# Patient Record
Sex: Female | Born: 1965 | Race: White | Hispanic: No | Marital: Married | Smoking: Former smoker
Health system: Southern US, Community
[De-identification: ages and names within clinical notes are randomized; demographics above are authoritative.]

## PROBLEM LIST (undated history)

## (undated) DIAGNOSIS — I7100 Dissection of unspecified site of aorta: Secondary | ICD-10-CM

## (undated) DIAGNOSIS — I2542 Coronary artery dissection: Secondary | ICD-10-CM

## (undated) HISTORY — PX: CARDIAC SURGERY: SHX584

---

## 2017-10-11 ENCOUNTER — Emergency Department (HOSPITAL_COMMUNITY)
Admission: EM | Admit: 2017-10-11 | Discharge: 2017-10-11 | Disposition: A | Discharge status: Home / Self Care | Payer: Medicare Other | Attending: Emergency Medicine | Admitting: Emergency Medicine

## 2017-10-11 ENCOUNTER — Encounter (HOSPITAL_COMMUNITY): Payer: Self-pay | Admitting: *Deleted

## 2017-10-11 ENCOUNTER — Emergency Department (HOSPITAL_COMMUNITY): Payer: Medicare Other

## 2017-10-11 DIAGNOSIS — K5792 Diverticulitis of intestine, part unspecified, without perforation or abscess without bleeding: Secondary | ICD-10-CM

## 2017-10-11 DIAGNOSIS — I2510 Atherosclerotic heart disease of native coronary artery without angina pectoris: Secondary | ICD-10-CM | POA: Insufficient documentation

## 2017-10-11 DIAGNOSIS — Z951 Presence of aortocoronary bypass graft: Secondary | ICD-10-CM | POA: Diagnosis not present

## 2017-10-11 DIAGNOSIS — R109 Unspecified abdominal pain: Secondary | ICD-10-CM

## 2017-10-11 DIAGNOSIS — R1032 Left lower quadrant pain: Secondary | ICD-10-CM | POA: Diagnosis present

## 2017-10-11 HISTORY — DX: Dissection of unspecified site of aorta: I71.00

## 2017-10-11 LAB — COMPREHENSIVE METABOLIC PANEL
ALBUMIN: 3.9 g/dL (ref 3.5–5.0)
ALT: 33 U/L (ref 0–44)
ANION GAP: 11 (ref 5–15)
AST: 28 U/L (ref 15–41)
Alkaline Phosphatase: 86 U/L (ref 38–126)
BILIRUBIN TOTAL: 0.5 mg/dL (ref 0.3–1.2)
BUN: 13 mg/dL (ref 6–20)
CO2: 27 mmol/L (ref 22–32)
Calcium: 9.6 mg/dL (ref 8.9–10.3)
Chloride: 105 mmol/L (ref 98–111)
Creatinine, Ser: 0.64 mg/dL (ref 0.44–1.00)
GFR calc Af Amer: >60 mL/min (ref >60)
GFR calc non Af Amer: >60 mL/min (ref >60)
Glucose, Bld: 121 mg/dL — ABNORMAL HIGH (ref 70–99)
POTASSIUM: 3.5 mmol/L (ref 3.5–5.1)
Sodium: 143 mmol/L (ref 135–145)
TOTAL PROTEIN: 7.5 g/dL (ref 6.5–8.1)

## 2017-10-11 LAB — URINALYSIS, ROUTINE W REFLEX MICROSCOPIC
BILIRUBIN URINE: NEGATIVE
Glucose, UA: NEGATIVE mg/dL
Ketones, ur: NEGATIVE mg/dL
Nitrite: NEGATIVE
PROTEIN: NEGATIVE mg/dL
Specific Gravity, Urine: 1.024 (ref 1.005–1.030)
pH: 5 (ref 5.0–8.0)

## 2017-10-11 LAB — CBC
HCT: 40.5 % (ref 36.0–46.0)
Hemoglobin: 13.2 g/dL (ref 12.0–15.0)
MCH: 30.3 pg (ref 26.0–34.0)
MCHC: 32.6 g/dL (ref 30.0–36.0)
MCV: 92.9 fL (ref 78.0–100.0)
PLATELETS: 395 10*3/uL (ref 150–400)
RBC: 4.36 MIL/uL (ref 3.87–5.11)
RDW: 12.5 % (ref 11.5–15.5)
WBC: 8.1 10*3/uL (ref 4.0–10.5)

## 2017-10-11 LAB — URINALYSIS, MICROSCOPIC (REFLEX)

## 2017-10-11 LAB — LIPASE, BLOOD: LIPASE: 28 U/L (ref 11–51)

## 2017-10-11 MED ORDER — HYDROMORPHONE HCL 1 MG/ML IJ SOLN
1.0000 mg | Freq: Once | INTRAMUSCULAR | Status: AC
Start: 2017-10-11 — End: 2017-10-11
  Administered 2017-10-11: 1 mg via INTRAVENOUS
  Filled 2017-10-11: qty 1

## 2017-10-11 MED ORDER — OXYCODONE-ACETAMINOPHEN 5-325 MG PO TABS: 1.0000 | ORAL_TABLET | ORAL | 0 refills | Status: DC | PRN

## 2017-10-11 MED ORDER — SODIUM CHLORIDE 0.9 % IV BOLUS
500.0000 mL | Freq: Once | INTRAVENOUS | Status: AC
Start: 2017-10-11 — End: 2017-10-11
  Administered 2017-10-11: 500 mL via INTRAVENOUS

## 2017-10-11 MED ORDER — AMOXICILLIN-POT CLAVULANATE 875-125 MG PO TABS: 1.0000 | ORAL_TABLET | Freq: Two times a day (BID) | ORAL | 0 refills | Status: DC

## 2017-10-11 NOTE — ED Notes (Signed)
Pt using restroom providing Urine sample.

## 2017-10-11 NOTE — ED Triage Notes (Signed)
Pt complains of left sided flank pain since yesterday. Pt states she has increased urinary frequency at night. Pt also states [place] from [place] yesterday. Pt denies hematuria.

## 2017-10-11 NOTE — ED Notes (Signed)
Urine Culture sent to lab just in case Culture is ordered after urine collection.

## 2017-10-11 NOTE — ED Provider Notes (Addendum)
[facility] Emergency Department Provider Note MRN:  [id number]  Arrival date & time: 10/11/17     Chief Complaint   Flank Pain   History of Present Illness   Bethany ShipperKaren Newman is a 52 y.o. year-old female with a history of CAD and multiple stents presenting to the ED with chief complaint of left flank pain.  The pain is located in the left flank and radiates to the left lower quadrant.  The pain is been present for 3 days, began gradually, and has been constant since that time.  Pain is described as moderate in severity, achy pain.  Pain not relieved by home ibuprofen.  Patient denies fevers or chills, no chest pain or shortness of breath, no dysuria.  Review of Systems  A complete 10 system review of systems was obtained and all systems are negative except as noted in the HPI and PMH.  Patient's Health History    PMH:  CAD, CABG  Past Surgical History:  Procedure Laterality Date  . CARDIAC SURGERY      No family history on file.  Social History   Socioeconomic History  . Marital status: Married    Spouse name: Not on file  . Number of children: Not on file  . Years of education: Not on file  . Highest education level: Not on file  Occupational History  . Not on file  Social Needs  . Financial resource strain: Not on file  . Food insecurity:    Worry: Not on file    Inability: Not on file  . Transportation needs:    Medical: Not on file    Non-medical: Not on file  Tobacco Use  . Smoking status: Former Games developermoker  . Smokeless tobacco: Never Used  Substance and Sexual Activity  . Alcohol use: Yes  . Drug use: Not on file  . Sexual activity: Not on file  Lifestyle  . Physical activity:    Days per week: Not on file    Minutes per session: Not on file  . Stress: Not on file  Relationships  . Social connections:    Talks on phone: Not on file    Gets together: Not on file    Attends religious service: Not on file    Active member of club or  organization: Not on file    Attends meetings of clubs or organizations: Not on file    Relationship status: Not on file  . Intimate partner violence:    Fear of current or ex partner: Not on file    Emotionally abused: Not on file    Physically abused: Not on file    Forced sexual activity: Not on file  Other Topics Concern  . Not on file  Social History Narrative  . Not on file     Physical Exam  Vital Signs and Nursing Notes reviewed Vitals:   10/11/17 1200 10/11/17 1230  BP: (!) 163/98 (!) 146/82  Pulse: 80 78  Resp: 18 18  Temp:    SpO2: 98% 97%    CONSTITUTIONAL:  well-appearing, NAD NEURO:  Alert and oriented x 3, no focal deficits EYES:  eyes equal and reactive ENT/NECK:  no LAD, no JVD CARDIO:  regular rate, well-perfused, normal S1 and S2 PULM:  CTAB no wheezing or rhonchi GI/GU:  normal bowel sounds, non-distended, non-tender MSK/SPINE:  No gross deformities, no edema SKIN:  no rash, atraumatic PSYCH:  Appropriate speech and behavior  Diagnostic and Interventional Summary  EKG Interpretation  Date/Time:    Ventricular Rate:    PR Interval:    QRS Duration:   QT Interval:    QTC Calculation:   R Axis:     Text Interpretation:        Labs Reviewed  URINALYSIS, ROUTINE W REFLEX MICROSCOPIC - Abnormal; Notable for the following components:      Result Value   Hgb urine dipstick SMALL (*)    Leukocytes, UA SMALL (*)    All other components within normal limits  COMPREHENSIVE METABOLIC PANEL - Abnormal; Notable for the following components:   Glucose, Bld 121 (*)    All other components within normal limits  URINALYSIS, MICROSCOPIC (REFLEX) - Abnormal; Notable for the following components:   Bacteria, UA RARE (*)    All other components within normal limits  CBC  LIPASE, BLOOD    CT RENAL STONE STUDY  Final Result      Medications  HYDROmorphone (DILAUDID) injection 1 mg (1 mg Intravenous Given 10/11/17 1233)  sodium chloride 0.9 % bolus  500 mL (500 mLs Intravenous New Bag/Given 10/11/17 1232)     Procedures Critical Care  ED Course and Medical Decision Making  I have reviewed the triage vital signs and the nursing notes.  Pertinent labs & imaging results that were available during my care of the patient were reviewed by me and considered in my medical decision making (see below for details).  53 year old female with significant CAD multiple stents in the past as well as 3 episodes of cardiac arrest years ago presenting with chief complaint of left flank pain that has been progressively worse and constant for the past 3 days.  History of kidney stones on the right years ago.  Vital signs stable, afebrile well-appearing, left CVA tenderness as well as left lower quadrant tenderness to palpation.  Favoring kidney stone versus diverticulitis.  Patient also has history of fibromuscular dysplasia and aneurysms.  Exam and symptomatology not concerning for abdominal ischemia.  Will obtain labs and CT.  [MB]  CT of the abdomen reveals diverticulitis.  No evidence of vascular abnormality.  Patient continues to be well-appearing with stable vitals.  Will give prescription for Augmentin.  We will follow-up closely with her regular doctors.  After the discussed management above, the patient was determined to be safe for discharge.  The patient was in agreement with this plan and all questions regarding their care were answered.  ED return precautions were discussed and the patient will return to the ED with any significant worsening of condition.  Addendum: Upon review of the chart it was observed that a past medical history of aortic dissection was noted by nursing staff during the patient's ED encounter.  This was surprising to me, as we discussed the patient's past medical history in depth and she denied such significant issues.  I was able to call the patient after discharge, she had already filled her prescription and was home generally  feeling well.  She denied ever having a history of aortic dissection, explaining that it was a dissection of her LAD.  This issue has been settled and nonactive for some time.  The CT performed today was noncontrast given the initial concern for kidney stone or diverticulitis.  Explained to patient that without IV contrast, aortic pathology cannot be completely excluded.  Patient agrees to return to the nearest emergency department with any change or worsening of her condition.  Elmer Sow. Pilar Plate, MD [facility] Emergency Medicine [facility]  [facility] [email or web address]  Final Clinical Impressions(s) / ED Diagnoses  No diagnosis found.  ED Discharge Orders    None         Sabas Sous, MD 10/11/17 1715    Sabas Sous, MD 10/11/17 [phone]

## 2017-10-11 NOTE — Discharge Instructions (Addendum)
[facility].  After careful evaluation, we did not find any emergent condition requiring admission or further testing in the hospital.  Your symptoms today seem to be due to diverticulitis.  Please take the antibiotics as directed and follow-up with your regular care provider.  Use the pain medication as needed.  Please return to the Emergency Department if you experience any worsening of your condition.  We encourage you to follow up with a primary care provider.  Thank you for allowing us to be a part of your care.

## 2017-10-12 ENCOUNTER — Emergency Department (HOSPITAL_COMMUNITY)
Admission: EM | Admit: 2017-10-12 | Discharge: 2017-10-12 | Disposition: A | Discharge status: Home / Self Care | Payer: Medicare Other | Attending: Emergency Medicine | Admitting: Emergency Medicine

## 2017-10-12 ENCOUNTER — Other Ambulatory Visit: Payer: Self-pay

## 2017-10-12 ENCOUNTER — Encounter (HOSPITAL_COMMUNITY): Payer: Self-pay

## 2017-10-12 DIAGNOSIS — Z79899 Other long term (current) drug therapy: Secondary | ICD-10-CM | POA: Diagnosis not present

## 2017-10-12 DIAGNOSIS — R109 Unspecified abdominal pain: Secondary | ICD-10-CM | POA: Diagnosis not present

## 2017-10-12 DIAGNOSIS — Z87891 Personal history of nicotine dependence: Secondary | ICD-10-CM | POA: Insufficient documentation

## 2017-10-12 DIAGNOSIS — Z7982 Long term (current) use of aspirin: Secondary | ICD-10-CM | POA: Insufficient documentation

## 2017-10-12 DIAGNOSIS — B029 Zoster without complications: Secondary | ICD-10-CM | POA: Insufficient documentation

## 2017-10-12 DIAGNOSIS — I2542 Coronary artery dissection: Secondary | ICD-10-CM

## 2017-10-12 HISTORY — DX: Coronary artery dissection: I25.42

## 2017-10-12 MED ORDER — VALACYCLOVIR HCL 1 G PO TABS: 1000.0000 mg | ORAL_TABLET | Freq: Three times a day (TID) | ORAL | 0 refills | Status: DC

## 2017-10-12 MED ORDER — PREDNISONE 20 MG PO TABS: ORAL_TABLET | ORAL | 0 refills | Status: DC

## 2017-10-12 NOTE — Discharge Instructions (Addendum)
It was our pleasure to provide your ER care today - we hope that you feel better.  Take valtrex and prednisone as prescribed.  Your blood pressure is high today - follow up with primary care doctor in the next 1-2 weeks.  Return to ER if worse, new symptoms, high fevers, persistent vomiting, other concern.

## 2017-10-12 NOTE — ED Provider Notes (Signed)
[facility]-EMERGENCY DEPT Provider Note   CSN: [id number] Arrival date & time: 10/12/17  1357     History   Chief Complaint Chief Complaint  Patient presents with  . Flank Pain    HPI Bethany Newman is a 52 y.o. female.  Patient c/o pain to left flank for past 2 days. Pain constant, dull, wraps around left flank, mod-severe, and today developed a sparse rash in same area as distribution of pain. No specific exacerbating or alleviating factors. No hx same rash. Denies fever or chills. No vomiting. Is having normal bms. No dysuria or hematuria. Recently had ct eval for same symptoms, but did not have rash then.   The history is provided by the patient.  Flank Pain  Pertinent negatives include no chest pain, no headaches and no shortness of breath.    Past Medical History:  Diagnosis Date  . Aortic dissection (HCC)   . Spontaneous dissection of coronary artery     Patient Active Problem List   Diagnosis Date Noted  . Spontaneous dissection of coronary artery     Past Surgical History:  Procedure Laterality Date  . CARDIAC SURGERY       OB History   None      Home Medications    Prior to Admission medications   Medication Sig Start Date End Date Taking? Authorizing Provider  amoxicillin-clavulanate (AUGMENTIN) 875-125 MG tablet Take 1 tablet by mouth every 12 (twelve) hours. 10/11/17  Yes Sabas Sous, MD  aspirin EC 81 MG tablet Take 81 mg by mouth daily.   Yes [provider]  busPIRone (BUSPAR) 10 MG tablet Take 10 mg by mouth daily.   Yes [provider]  diltiazem (CARDIZEM CD) 240 MG 24 hr capsule Take 240 mg by mouth daily.   Yes [provider]  FLUoxetine (PROZAC) 40 MG capsule Take 40 mg by mouth daily.   Yes [provider]  metoprolol succinate (TOPROL-XL) 50 MG 24 hr tablet Take 50 mg by mouth daily. 09/10/17  Yes [provider]  nitroGLYCERIN (NITROLINGUAL) 0.4 MG/SPRAY spray Place 1  spray under the tongue every 5 (five) minutes x 3 doses as needed for chest pain.   Yes [provider]  ranolazine (RANEXA) 500 MG 12 hr tablet Take 500 mg by mouth 2 (two) times daily.   Yes [provider]  oxyCODONE-acetaminophen (PERCOCET/ROXICET) 5-325 MG tablet Take 1 tablet by mouth every 4 (four) hours as needed for severe pain. 10/11/17   Sabas Sous, MD    Family History No family history on file.  Social History Social History   Tobacco Use  . Smoking status: Former Games developer  . Smokeless tobacco: Never Used  Substance Use Topics  . Alcohol use: Yes  . Drug use: Not on file     Allergies   Patient has no known allergies.   Review of Systems Review of Systems  Constitutional: Negative for fever.  HENT: Negative for sore throat.   Eyes: Negative for redness.  Respiratory: Negative for shortness of breath.   Cardiovascular: Negative for chest pain.  Gastrointestinal: Negative for constipation and diarrhea.  Genitourinary: Positive for flank pain. Negative for dysuria.  Musculoskeletal: Negative for neck pain.  Skin: Positive for rash.  Neurological: Negative for headaches.  Hematological: Does not bruise/bleed easily.  Psychiatric/Behavioral: Negative for confusion.     Physical Exam Updated Vital Signs BP (!) 162/90   Pulse 83   Temp 98.2 F (36.8 C) (Oral)  Resp 18   SpO2 100%   Physical Exam  Constitutional: She appears well-developed and well-nourished.  HENT:  Head: Atraumatic.  Eyes: Conjunctivae are normal. No scleral icterus.  Neck: Neck supple. No tracheal deviation present.  Cardiovascular: Normal rate.  Pulmonary/Chest: Effort normal. No respiratory distress.  Abdominal: Soft. Normal appearance and bowel sounds are normal. She exhibits no distension. There is no tenderness.  Genitourinary:  Genitourinary Comments: No cva tenderness  Musculoskeletal: She exhibits no edema.  Neurological: She is alert.  Skin: Skin is  warm and dry. Rash noted.  Left back/flank erythematous, vesicular rash, single dermatome area, unilateral, felt c/w shingles. No cellulitis.   Psychiatric: She has a normal mood and affect.  Nursing note and vitals reviewed.    ED Treatments / Results  Labs (all labs ordered are listed, but only abnormal results are displayed) Labs Reviewed - No data to display  EKG None  Radiology Ct Renal Stone Study  Result Date: 10/11/2017 CLINICAL DATA:  Left-sided flank pain since yesterday. EXAM: CT ABDOMEN AND PELVIS WITHOUT CONTRAST TECHNIQUE: Multidetector CT imaging of the abdomen and pelvis was performed following the standard protocol without IV contrast. COMPARISON:  None. FINDINGS: Lower chest: No acute abnormality. Hepatobiliary: No focal liver abnormality is seen. No gallstones, gallbladder wall thickening, or biliary dilatation. Pancreas: Unremarkable. No pancreatic ductal dilatation or surrounding inflammatory changes. Spleen: Normal in size without focal abnormality. Adrenals/Urinary Tract: Adrenal glands are unremarkable. Kidneys are normal, without renal calculi, focal lesion, or hydronephrosis. Bladder is unremarkable. Stomach/Bowel: Stomach is within normal limits. Appendix appears normal. No evidence of small bowel wall thickening, distention, or inflammatory changes. Left colonic diverticulosis with mild mucosal thickening of the sigmoid colon. Vascular/Lymphatic: No significant vascular findings are present. No enlarged abdominal or pelvic lymph nodes. Reproductive: Status post hysterectomy. No adnexal masses. Other: Fat containing periumbilical anterior abdominal wall hernia. Musculoskeletal: L5-S1 spondylosis. IMPRESSION: No evidence of obstructive uropathy or nephrolithiasis. Left colonic diverticulosis with diffuse mucosal thickening of the sigmoid colon, possibly representing diverticulitis. 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 Electronically Signed   By:  Ted Mcalpineobrinka  Dimitrova M.D.   On: 10/11/2017 13:15    Procedures Procedures (including critical care time)  Medications Ordered in ED Medications - No data to display   Initial Impression / Assessment and Plan / ED Course  I have reviewed the triage vital signs and the nursing notes.  Pertinent labs & imaging results that were available during my care of the patient were reviewed by me and considered in my medical decision making (see chart for details).  Painful rash c/w shingles. Discussed dx w pt.   Confirmed nkda.  Reviewed nursing notes and prior charts for additional history.   rx valtrex.   Final Clinical Impressions(s) / ED Diagnoses   Final diagnoses:  Spontaneous dissection of coronary artery    ED Discharge Orders    None       Cathren LaineSteinl, [place] Er[place], MD 10/12/17 1626

## 2017-10-12 NOTE — ED Triage Notes (Signed)
She states she was seen here yesterday for left flank area pain and was dx with possible diverticulitis. She has hx of coronary artery dissection. She also states she has a "rash" beginning to appear at left flank area.

## 2020-07-10 ENCOUNTER — Other Ambulatory Visit: Payer: Self-pay

## 2020-07-10 ENCOUNTER — Emergency Department (HOSPITAL_COMMUNITY): Payer: Medicare Other

## 2020-07-10 ENCOUNTER — Encounter (HOSPITAL_COMMUNITY): Payer: Self-pay | Admitting: *Deleted

## 2020-07-10 ENCOUNTER — Emergency Department (HOSPITAL_COMMUNITY)
Admission: EM | Admit: 2020-07-10 | Discharge: 2020-07-10 | Disposition: A | Discharge status: Home / Self Care | Payer: Medicare Other | Attending: Emergency Medicine | Admitting: Emergency Medicine

## 2020-07-10 DIAGNOSIS — H6121 Impacted cerumen, right ear: Secondary | ICD-10-CM | POA: Diagnosis not present

## 2020-07-10 DIAGNOSIS — R531 Weakness: Secondary | ICD-10-CM | POA: Diagnosis present

## 2020-07-10 DIAGNOSIS — Z7982 Long term (current) use of aspirin: Secondary | ICD-10-CM | POA: Diagnosis not present

## 2020-07-10 DIAGNOSIS — U071 COVID-19: Secondary | ICD-10-CM | POA: Diagnosis not present

## 2020-07-10 DIAGNOSIS — R000 Tachycardia, unspecified: Secondary | ICD-10-CM | POA: Insufficient documentation

## 2020-07-10 DIAGNOSIS — Z87891 Personal history of nicotine dependence: Secondary | ICD-10-CM | POA: Diagnosis not present

## 2020-07-10 DIAGNOSIS — R42 Dizziness and giddiness: Secondary | ICD-10-CM | POA: Diagnosis not present

## 2020-07-10 LAB — BASIC METABOLIC PANEL
Anion gap: 10 (ref 5–15)
BUN: 7 mg/dL (ref 6–20)
CO2: 24 mmol/L (ref 22–32)
Calcium: 8.9 mg/dL (ref 8.9–10.3)
Chloride: 102 mmol/L (ref 98–111)
Creatinine, Ser: 0.82 mg/dL (ref 0.44–1.00)
GFR, Estimated: >60 mL/min (ref >60)
Glucose, Bld: 109 mg/dL — ABNORMAL HIGH (ref 70–99)
Potassium: 3.6 mmol/L (ref 3.5–5.1)
Sodium: 136 mmol/L (ref 135–145)

## 2020-07-10 LAB — RESP PANEL BY RT-PCR (RSV, FLU A&B, COVID)  RVPGX2
Influenza A by PCR: NEGATIVE
Influenza B by PCR: NEGATIVE
Resp Syncytial Virus by PCR: NEGATIVE
SARS Coronavirus 2 by RT PCR: POSITIVE — AB

## 2020-07-10 LAB — CBC WITH DIFFERENTIAL/PLATELET
Abs Immature Granulocytes: 0.02 10*3/uL (ref 0.00–0.07)
Basophils Absolute: 0 10*3/uL (ref 0.0–0.1)
Basophils Relative: 1 %
Eosinophils Absolute: 0 10*3/uL (ref 0.0–0.5)
Eosinophils Relative: 1 %
HCT: 36.1 % (ref 36.0–46.0)
Hemoglobin: 11.7 g/dL — ABNORMAL LOW (ref 12.0–15.0)
Immature Granulocytes: 1 %
Lymphocytes Relative: 12 %
Lymphs Abs: 0.5 10*3/uL — ABNORMAL LOW (ref 0.7–4.0)
MCH: 30.4 pg (ref 26.0–34.0)
MCHC: 32.4 g/dL (ref 30.0–36.0)
MCV: 93.8 fL (ref 80.0–100.0)
Monocytes Absolute: 0.6 10*3/uL (ref 0.1–1.0)
Monocytes Relative: 13 %
Neutro Abs: 3.2 10*3/uL (ref 1.7–7.7)
Neutrophils Relative %: 72 %
Platelets: 210 10*3/uL (ref 150–400)
RBC: 3.85 MIL/uL — ABNORMAL LOW (ref 3.87–5.11)
RDW: 11.9 % (ref 11.5–15.5)
WBC: 4.3 10*3/uL (ref 4.0–10.5)
nRBC: 0 % (ref 0.0–0.2)

## 2020-07-10 LAB — TROPONIN I (HIGH SENSITIVITY): Troponin I (High Sensitivity): 12 ng/L (ref <18)

## 2020-07-10 MED ORDER — MECLIZINE HCL 25 MG PO TABS: 25.0000 mg | ORAL_TABLET | Freq: Three times a day (TID) | ORAL | 0 refills | Status: AC | PRN

## 2020-07-10 MED ORDER — ACETAMINOPHEN 500 MG PO TABS
1000.0000 mg | ORAL_TABLET | Freq: Once | ORAL | Status: AC
  Administered 2020-07-10: 1000 mg via ORAL
  Filled 2020-07-10: qty 2

## 2020-07-10 MED ORDER — MECLIZINE HCL 25 MG PO TABS
25.0000 mg | ORAL_TABLET | Freq: Once | ORAL | Status: AC
  Administered 2020-07-10: 25 mg via ORAL
  Filled 2020-07-10: qty 1

## 2020-07-10 NOTE — ED Provider Notes (Signed)
[facility]-EMERGENCY DEPT Provider Note   CSN: [id number] Arrival date & time: 07/10/20  0102     History Chief Complaint  Patient presents with  . Weakness  . Dizziness    Bethany Newman is a 55 y.o. female.  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  Patient states all her symptoms started yesterday.  She started feeling general malaise, myalgias, fatigue, mild scratchy throat and chills.  She went to an urgent care and had a negative UA and had a negative home COVID test.  All through the night she had chills and felt feverish.  She has had a mild cough but did notice some shortness of breath yesterday that had minimal improvement with albuterol and Atrovent.  Yesterday she was having some pain in the back of her shoulder blades but this morning she had some pain in the front of her chest.  Has not noted as much shortness of breath today and sore throat has improved.  She continues to feel feverish but when she got out of bed this morning and even throughout the night she noticed that she was off balance.  She was having to hold onto the side of the wall to go to the bathroom and today when she was taking a shower and trying to get out of the shower she had 3 falls because of feeling off balance.  The symptoms are only present when she is walking and she does not feel dizzy while at rest.  She denies any vision changes.  No unilateral numbness or weakness.  No speech changes.  No headache or neck pain.  No new medications.  No abdominal pain, nausea or vomiting.  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  No unilateral leg pain or swelling.  No ear pain or hearing changes.  Patient has received her COVID-vaccine and booster as well as her flu shot.  The history is  provided by the patient.  Weakness Associated symptoms: dizziness   Dizziness Associated symptoms: weakness        Past Medical History:  Diagnosis Date  . Aortic dissection (HCC)   . Spontaneous dissection of coronary artery     Patient Active Problem List   Diagnosis Date Noted  . Spontaneous dissection of coronary artery     Past Surgical History:  Procedure Laterality Date  . CARDIAC SURGERY       OB History   No obstetric history on file.     No family history on file.  Social History   Tobacco Use  . Smoking status: Former Games developer  . Smokeless tobacco: Never Used  Substance Use Topics  . Alcohol use: Yes    Home Medications Prior to Admission medications   Medication Sig Start Date End Date Taking? Authorizing Provider  amoxicillin-clavulanate (AUGMENTIN) 875-125 MG tablet Take 1 tablet by mouth every 12 (twelve) hours. 10/11/17   Sabas Sous, MD  aspirin EC 81 MG tablet Take 81 mg by mouth daily.    [provider]  busPIRone (BUSPAR) 10 MG tablet Take 10 mg by mouth daily.    [provider]  diltiazem (CARDIZEM CD) 240 MG 24 hr capsule Take 240 mg by mouth daily.    [provider]  FLUoxetine (PROZAC) 40 MG capsule Take  40 mg by mouth daily.    [provider]  metoprolol succinate (TOPROL-XL) 50 MG 24 hr tablet Take 50 mg by mouth daily. 09/10/17   [provider]  nitroGLYCERIN (NITROLINGUAL) 0.4 MG/SPRAY spray Place 1 spray under the tongue every 5 (five) minutes x 3 doses as needed for chest pain.    [provider]  oxyCODONE-acetaminophen (PERCOCET/ROXICET) 5-325 MG tablet Take 1 tablet by mouth every 4 (four) hours as needed for severe pain. 10/11/17   Sabas Sous, MD  predniSONE (DELTASONE) 20 MG tablet 3 po once a day for 2 days, then 2 po once a day for 2 days, then 1 po once a day for 2 days 10/12/17   Cathren Laine, MD  ranolazine (RANEXA) 500 MG 12 hr tablet Take 500 mg by mouth 2  (two) times daily.    [provider]  valACYclovir (VALTREX) 1000 MG tablet Take 1 tablet (1,000 mg total) by mouth 3 (three) times daily. 10/12/17   Cathren Laine, MD    Allergies    Imdur [isosorbide nitrate]  Review of Systems   Review of Systems  Neurological: Positive for dizziness and weakness.  All other systems reviewed and are negative.   Physical Exam Updated Vital Signs BP (!) 156/85 (BP Location: Left Arm)   Pulse (!) 103   Temp 100 F (37.8 C) (Oral)   Resp 20   SpO2 97%   Physical Exam Vitals and nursing note reviewed.  Constitutional:      General: She is not in acute distress.    Appearance: She is well-developed. She is diaphoretic.     Comments: Hot and clammy skin  HENT:     Head: Normocephalic and atraumatic.     Right Ear: There is impacted cerumen.     Left Ear: Tympanic membrane normal.  Eyes:     Extraocular Movements: Extraocular movements intact.     Conjunctiva/sclera: Conjunctivae normal.     Pupils: Pupils are equal, round, and reactive to light.     Comments: No nystagmus  Neck:     Trachea: Trachea normal.     Meningeal: Brudzinski's sign and Kernig's sign absent.  Cardiovascular:     Rate and Rhythm: Regular rhythm. Tachycardia present.     Heart sounds: Normal heart sounds. No murmur heard. No friction rub.  Pulmonary:     Effort: Pulmonary effort is normal.     Breath sounds: Normal breath sounds. No wheezing or rales.  Abdominal:     General: Bowel sounds are normal. There is no distension.     Palpations: Abdomen is soft.     Tenderness: There is no abdominal tenderness. There is no guarding or rebound.  Musculoskeletal:        General: No tenderness. Normal range of motion.     Cervical back: Normal range of motion and neck supple. No tenderness. No spinous process tenderness or muscular tenderness. Normal range of motion.     Comments: No edema  Skin:    General: Skin is warm.     Findings: No rash.   Neurological:     Mental Status: She is alert and oriented to person, place, and time.     Cranial Nerves: No cranial nerve deficit.     Sensory: No sensory deficit.     Motor: No weakness or pronator drift.     Coordination: Coordination is intact. Coordination normal. Heel to Shin Test normal.     Gait: Gait abnormal.  Comments: Off balance with walking and having to hold the wall  Psychiatric:        Mood and Affect: Mood normal.        Behavior: Behavior normal.        Thought Content: Thought content normal.     ED Results / Procedures / Treatments   Labs (all labs ordered are listed, but only abnormal results are displayed) Labs Reviewed  RESP PANEL BY RT-PCR (RSV, FLU A&B, COVID)  RVPGX2 - Abnormal; Notable for the following components:      Result Value   SARS Coronavirus 2 by RT PCR POSITIVE (*)    All other components within normal limits  CBC WITH DIFFERENTIAL/PLATELET - Abnormal; Notable for the following components:   RBC 3.85 (*)    Hemoglobin 11.7 (*)    Lymphs Abs 0.5 (*)    All other components within normal limits  BASIC METABOLIC PANEL - Abnormal; Notable for the following components:   Glucose, Bld 109 (*)    All other components within normal limits  TROPONIN I (HIGH SENSITIVITY)  TROPONIN I (HIGH SENSITIVITY)    EKG EKG Interpretation  Date/Time:  Monday July 10 2020 11:46:38 EDT Ventricular Rate:  82 PR Interval:  148 QRS Duration: 93 QT Interval:  382 QTC Calculation: 447 R Axis:   82 Text Interpretation: Sinus rhythm Normal ECG No significant change since last tracing Confirmed by Gwyneth Sprout ([id number]) on 07/10/2020 12:17:51 PM   Radiology CT Head Wo Contrast  Result Date: 07/10/2020 CLINICAL DATA:  Dizziness, nonspecific. Additional history provided: Patient reports weakness, dizziness, cough, chills, fall in shower this morning. EXAM: CT HEAD WITHOUT CONTRAST TECHNIQUE: Contiguous axial images were obtained from the base of the  skull through the vertex without intravenous contrast. COMPARISON:  No pertinent prior exams available for comparison. FINDINGS: Brain: Cerebral volume is normal. There is no acute intracranial hemorrhage. No demarcated cortical infarct. No extra-axial fluid collection. No evidence of intracranial mass. No midline shift. Vascular: No hyperdense vessel. Skull: Normal. Negative for fracture or focal lesion. Sinuses/Orbits: Visualized orbits show no acute finding. Moderate bilateral ethmoid sinus mucosal thickening. Trace mucosal thickening within the bilateral sphenoid and visualized maxillary sinuses. IMPRESSION: Unremarkable non-contrast CT appearance of the brain. No evidence of acute intracranial abnormality. Paranasal sinus disease, as described. Electronically Signed   By: Jackey Loge DO   On: 07/10/2020 11:33   DG Chest Port 1 View  Result Date: 07/10/2020 CLINICAL DATA:  Chest pain. EXAM: PORTABLE CHEST 1 VIEW COMPARISON:  None. FINDINGS: The heart size and mediastinal contours are within normal limits. Both lungs are clear. Sternotomy wires are noted. No pneumothorax or pleural effusion is noted. The visualized skeletal structures are unremarkable. IMPRESSION: No active disease. Electronically Signed   By: Lupita Raider M.D.   On: 07/10/2020 10:57    Procedures Procedures   Medications Ordered in ED Medications  acetaminophen (TYLENOL) tablet 1,000 mg (has no administration in time range)    ED Course  I have reviewed the triage vital signs and the nursing notes.  Pertinent labs & imaging results that were available during my care of the patient were reviewed by me and considered in my medical decision making (see chart for details).    MDM Rules/Calculators/A&P                          55 year old female presenting today with flulike symptoms in addition to sensation of being off  balance with now 3 falls this morning.  Patient has no significant headache or neck pain.  Low  suspicion for vertebral artery dissection, meningitis, encephalitis.  Patient does have a known brain aneurysm that has not required any intervention but is being watched regularly and low suspicion for ruptured aneurysm as patient does not have significant headache.  While lying in the bed her exam is normal and there are no acute neurologic findings.  When patient gets out of bed she is off balance using the wall to steady herself.  No evidence of vesicular lesions when checking her TMs suggestive of vestibular neuritis.  Patient was having some nonspecific chest pain and in the past she has had spontaneous coronary artery dissection.  Will check EKG and troponins however suspect this is more related to the acute infectious component.  She has no abdominal pain or reason to believe she has acute abdominal pathology.  She had a UA done yesterday urgent care that was normal.  Will check for flu, labs including head CT.  Patient given meclizine.  She has been drinking plenty of fluids and does not feel like she is dehydrated.  Also given Tylenol for fever.  12:48 PM Patient's labs are reassuring, troponin negative, CBC without acute findings, BMP within normal limits, COVID is positive today and flu negative.  Head CT without acute findings and chest x-ray within normal limits.  After meclizine patient's dizziness is significantly improved.  She is able to ambulate to the bathroom without difficulty or ataxia.  Feel the patient is stable for discharge home.  Given her cardiac history and some of her medication she is not a candidate for Paxlovid.  Patient will continue supportive care.  She will follow-up if symptoms worsen.  Given meclizine to use for dizziness.  MDM Number of Diagnoses or Management Options   Amount and/or Complexity of Data Reviewed Clinical lab tests: reviewed and ordered Tests in the radiology section of CPT: reviewed and ordered Tests in the medicine section of CPT: ordered and  reviewed Decide to obtain previous medical records or to obtain history from someone other than the patient: yes Review and summarize past medical records: yes Independent visualization of images, tracings, or specimens: yes  Risk of Complications, Morbidity, and/or Mortality Presenting problems: moderate Diagnostic procedures: moderate Management options: moderate  Patient Progress Patient progress: improved   Final Clinical Impression(s) / ED Diagnoses Final diagnoses:  COVID  Vertigo    Rx / DC Orders ED Discharge Orders         Ordered    meclizine (ANTIVERT) 25 MG tablet  3 times daily PRN        07/10/20 1252           Gwyneth SproutPlunkett, Merisa Julio, MD 07/10/20 1252

## 2020-07-10 NOTE — Discharge Instructions (Signed)
Make sure you are staying hydrated at home, Tylenol as needed for fever, you can use the meclizine as needed for dizziness.  However if you are not dizzy you do not have to take it.  You need to remain quarantined for a total of 5 days since your symptoms started.

## 2020-07-10 NOTE — ED Triage Notes (Signed)
Pt complains of weakness, dizziness, cough, chills. She fell in the shower this morning. No loss of consciousness, no injury. Went to ED yesterday and had urinalysis performed, was negative and told she probably had flu. Had negative at home COVID test yesterday.

## 2020-07-11 ENCOUNTER — Telehealth: Payer: Self-pay | Admitting: Infectious Diseases

## 2020-07-11 ENCOUNTER — Telehealth (HOSPITAL_COMMUNITY): Payer: Self-pay

## 2020-07-11 DIAGNOSIS — U071 COVID-19: Secondary | ICD-10-CM

## 2020-07-11 MED ORDER — NIRMATRELVIR/RITONAVIR (PAXLOVID)TABLET: 3.0000 | ORAL_TABLET | Freq: Two times a day (BID) | ORAL | 0 refills | Status: AC

## 2020-07-11 NOTE — Telephone Encounter (Signed)
Called to discuss with patient about COVID-19 symptoms and the use of one of the available treatments for those with mild to moderate Covid symptoms and at a high risk of hospitalization.  Pt appears to qualify for outpatient treatment due to co-morbid conditions and/or a member of an at-risk group in accordance with the FDA Emergency Use Authorization.    Symptom onset: 4/23 late afternoon with sinus symptoms. Sunday symptoms got way worse.  Vaccinated: yes Booster? yes Immunocompromised? no Qualifiers: CAD, HTN, overweight BMI NIH Criteria: Bethany Newman     Outpatient Oral COVID Treatment Note  I connected with Bethany Newman on 07/11/2020/8:42 PM by telephone and verified that I am speaking with the correct person using two identifiers.  I discussed the limitations, risks, security, and privacy concerns of performing an evaluation and management service by telephone and the availability of in person appointments. I also discussed with the patient that there may be a patient responsible charge related to this service. The patient expressed understanding and agreed to proceed.  Patient location: [place] residence Provider location: [place] residence  Diagnosis: COVID-19 infection  Purpose of visit: Discussion of potential use of Molnupiravir or Paxlovid, a new treatment for mild to moderate COVID-19 viral infection in non-hospitalized patients.   Subjective: Patient is a 55 y.o. female who has been diagnosed with COVID 19 viral infection.  Their symptoms began on 4/23 with URI/sinusitis.    Past Medical History:  Diagnosis Date  . 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 Spontaneous dissection of coronary artery     Allergies  Allergen Reactions  . Imdur [Isosorbide Nitrate]      Current Outpatient Medications:  .  albuterol (VENTOLIN HFA) 108 (90 Base) MCG/ACT inhaler, Inhale into the lungs., Disp: , Rfl:  .  aspirin 81 MG chewable tablet, Chew by mouth., Disp: , Rfl:  .  atorvastatin (LIPITOR)  20 MG tablet, Take 1 tablet by mouth at bedtime., Disp: , Rfl:  .  Baclofen 5 MG TABS, Take 1 tablet by mouth every 8 (eight) hours as needed., Disp: , Rfl:  .  busPIRone (BUSPAR) 10 MG tablet, Take 10 mg by mouth daily., Disp: , Rfl:  .  butalbital-acetaminophen-caffeine (FIORICET) 50-325-40 MG tablet, Take 1 tablet by mouth as needed., Disp: , Rfl:  .  diltiazem (CARDIZEM CD) 240 MG 24 hr capsule, Take 240 mg by mouth daily., Disp: , Rfl:  .  diltiazem (TIAZAC) 240 MG 24 hr capsule, Take 1 capsule by mouth daily., Disp: , Rfl:  .  diphenhydrAMINE (SOMINEX) 25 MG tablet, Take by mouth., Disp: , Rfl:  .  FLUoxetine (PROZAC) 40 MG capsule, Take 40 mg by mouth daily., Disp: , Rfl:  .  fluticasone (FLONASE) 50 MCG/ACT nasal spray, Administer 2 sprays into both nostrils daily., Disp: , Rfl:  .  ibuprofen (ADVIL) 200 MG tablet, Take by mouth., Disp: , Rfl:  .  meclizine (ANTIVERT) 25 MG tablet, Take 1 tablet (25 mg total) by mouth 3 (three) times daily as needed for dizziness., Disp: 30 tablet, Rfl: 0 .  metoprolol succinate (TOPROL-XL) 50 MG 24 hr tablet, Take 1 tablet by mouth daily., Disp: , Rfl:  .  nirmatrelvir/ritonavir EUA (PAXLOVID) TABS, Take 3 tablets by mouth 2 (two) times daily for 5 days. Take nirmatrelvir (150 mg) 2 tablet(s) twice daily for 5 days and ritonavir (100 mg) one tablet twice daily for 5 days., Disp: 30 tablet, Rfl: 0 .  ranolazine (RANEXA) 500 MG 12 hr tablet, Take 1  tablet by mouth every 12 (twelve) hours., Disp: , Rfl:  .  ID NOW COVID-19 KIT, TEST AS DIRECTED TODAY, Disp: , Rfl:  .  metoprolol succinate (TOPROL-XL) 50 MG 24 hr tablet, Take 50 mg by mouth daily., Disp: , Rfl: 3 .  nitroGLYCERIN (NITROLINGUAL) 0.4 MG/SPRAY spray, Place 1 spray under the tongue every 5 (five) minutes x 3 doses as needed for chest pain. (Patient not taking: Reported on 07/11/2020), Disp: , Rfl:   Objective: Patient appears/sounds mildly ill.  They are in no apparent distress.  Breathing is non  labored.  Mood and behavior are normal.  Laboratory Data:  Recent Results (from the past 2160 hour(s))  Resp panel by RT-PCR (RSV, Flu A&B, Covid)     Status: Abnormal   Collection Time: 07/10/20 10:33 AM  Result Value Ref Range   SARS Coronavirus 2 by RT PCR POSITIVE (A) NEGATIVE    Comment: RESULT CALLED TO, READ BACK BY AND VERIFIED WITH: DANIEL.C. RN AT 4193 07/10/20 MULLINS,T (NOTE) SARS-CoV-2 target nucleic acids are DETECTED.  The SARS-CoV-2 RNA is generally detectable in upper respiratory specimens during the acute phase of infection. Positive results are indicative of the presence of the identified virus, but do not rule out bacterial infection or co-infection with other pathogens not detected by the test. Clinical correlation with patient history and other diagnostic information is necessary to determine patient infection status. The expected result is Negative.  Fact Sheet for Patients: EntrepreneurPulse.com.au  Fact Sheet for Healthcare Providers: IncredibleEmployment.be  This test is not yet approved or cleared by the [place] FDA and  has been authorized for detection and/or diagnosis of SARS-CoV-2 by FDA under an Emergency Use Authorization (EUA).  This EUA will remain in effect (meaning this test  can be used) for the duration of  the COVID-19 declaration under Section 564(b)(1) of the Act, 21 U.S.C. section 360bbb-3(b)(1), unless the authorization is terminated or revoked sooner.     Influenza A by PCR NEGATIVE NEGATIVE   Influenza B by PCR NEGATIVE NEGATIVE    Comment: (NOTE) The Xpert Xpress SARS-CoV-2/FLU/RSV plus assay is intended as an aid in the diagnosis of influenza from Nasopharyngeal swab specimens and should not be used as a sole basis for treatment. Nasal washings and aspirates are unacceptable for Xpert Xpress SARS-CoV-2/FLU/RSV testing.  Fact Sheet for  Patients: EntrepreneurPulse.com.au  Fact Sheet for Healthcare Providers: IncredibleEmployment.be  This test is not yet approved or cleared by the [place] FDA and has been authorized for detection and/or diagnosis of SARS-CoV-2 by FDA under an Emergency Use Authorization (EUA). This EUA will remain in effect (meaning this test can be used) for the duration of the COVID-19 declaration under Section 564(b)(1) of the Act, 21 U.S.C. section 360bbb-3(b)(1), unless the authorization is terminated or revoked.     Resp Syncytial Virus by PCR NEGATIVE NEGATIVE    Comment: (NOTE) Fact Sheet for Patients: EntrepreneurPulse.com.au  Fact Sheet for Healthcare Providers: IncredibleEmployment.be  This test is not yet approved or cleared by the [place] FDA and has been authorized for detection and/or diagnosis of SARS-CoV-2 by FDA under an Emergency Use Authorization (EUA). This EUA will remain in effect (meaning this test can be used) for the duration of the COVID-19 declaration under Section 564(b)(1) of the Act, 21 U.S.C. section 360bbb-3(b)(1), unless the authorization is terminated or revoked.  Performed at [facility], [place] [address] [place], [place] [address]   CBC with Differential/Platelet     Status: Abnormal  Collection Time: 07/10/20 11:08 AM  Result Value Ref Range   WBC 4.3 4.0 - 10.5 K/uL   RBC 3.85 (L) 3.87 - 5.11 MIL/uL   Hemoglobin 11.7 (L) 12.0 - 15.0 g/dL   HCT 36.1 36.0 - 46.0 %   MCV 93.8 80.0 - 100.0 fL   MCH 30.4 26.0 - 34.0 pg   MCHC 32.4 30.0 - 36.0 g/dL   RDW 11.9 11.5 - 15.5 %   Platelets 210 150 - 400 K/uL   nRBC 0.0 0.0 - 0.2 %   Neutrophils Relative % 72 %   Neutro Abs 3.2 1.7 - 7.7 K/uL   Lymphocytes Relative 12 %   Lymphs Abs 0.5 (L) 0.7 - 4.0 K/uL   Monocytes Relative 13 %   Monocytes Absolute 0.6 0.1 - 1.0 K/uL   Eosinophils Relative 1 %    Eosinophils Absolute 0.0 0.0 - 0.5 K/uL   Basophils Relative 1 %   Basophils Absolute 0.0 0.0 - 0.1 K/uL   Immature Granulocytes 1 %   Abs Immature Granulocytes 0.02 0.00 - 0.07 K/uL    Comment: Performed at [facility], [place] [address], [place], [place] [address]  Basic metabolic panel     Status: Abnormal   Collection Time: 07/10/20 11:08 AM  Result Value Ref Range   Sodium 136 135 - 145 mmol/L   Potassium 3.6 3.5 - 5.1 mmol/L   Chloride 102 98 - 111 mmol/L   CO2 24 22 - 32 mmol/L   Glucose, Bld 109 (H) 70 - 99 mg/dL    Comment: Glucose reference range applies only to samples taken after fasting for at least 8 hours.   BUN 7 6 - 20 mg/dL   Creatinine, Ser 0.82 0.44 - 1.00 mg/dL   Calcium 8.9 8.9 - 10.3 mg/dL   GFR, Estimated >60 >60 mL/min    Comment: (NOTE) Calculated using the CKD-EPI Creatinine Equation (2021)    Anion gap 10 5 - 15    Comment: Performed at [facility], [place] [address], [place], [place] [address]  Troponin I (High Sensitivity)     Status: None   Collection Time: 07/10/20 11:08 AM  Result Value Ref Range   Troponin I (High Sensitivity) 12 <18 ng/L    Comment: (NOTE) Elevated high sensitivity troponin I (hsTnI) values and significant  changes across serial measurements may suggest ACS but many other  chronic and acute conditions are known to elevate hsTnI results.  Refer to the "Links" section for chest pain algorithms and additional  guidance. Performed at [facility], [place] [address], [place], [place] [address]      Assessment: 55 y.o. female with mild/moderate COVID 19 viral infection diagnosed on 4/25 at high risk for progression to severe COVID 19.  Plan:  This patient is a 55 y.o. female that meets the following criteria for Emergency Use Authorization of: Paxlovid 1. Age >12 yr AND > 40 kg 2. SARS-COV-2 positive test 3. Symptom onset < 5 days 4. Mild-to-moderate COVID disease with high  risk for severe progression to hospitalization or death  I have spoken and communicated the following to the patient or parent/caregiver regarding: 1. Paxlovid is an unapproved drug that is authorized for use under an Emergency Use Authorization.  2. There are no adequate, approved, available products for the treatment of COVID-19 in adults who have mild-to-moderate COVID-19 and are at high risk for progressing to severe COVID-19, including hospitalization or death. 3. Other therapeutics are  currently authorized. For additional information on all products authorized for treatment or prevention of COVID-19, please see [employer]pl.  4. There are benefits and risks of taking this treatment as outlined in the "Fact Sheet for Patients and Caregivers."  5. "Fact Sheet for Patients and Caregivers" was reviewed with patient. A hard copy will be provided to patient from pharmacy prior to the patient receiving treatment. 6. Patients should continue to self-isolate and use infection control measures (e.g., wear mask, isolate, social distance, avoid sharing personal items, clean and disinfect "high touch" surfaces, and frequent handwashing) according to CDC guidelines.  7. The patient or parent/caregiver has the option to accept or refuse treatment. 8. Patient medication history was reviewed for potential drug interactions:Interaction with home meds: Crestor, Flonase, nitro and Ranexa - patient will hold all 3 for the duration of treatment. She will contact cardiology re: instructions for ranexa if she experiences chest pain 9. Patient's GFR was calculated to be >60, and they were therefore prescribed Normal dose (GFR>60) - nirmatrelvir 159m tab (2 tablet) by mouth twice daily AND ritonavir 1083mtab (1 tablet) by mouth twice daily   After reviewing above information with the patient, the patient agrees  to receive Paxlovid.  Follow up instructions:    . Take prescription BID x 5 days as directed . Reach out to pharmacist for counseling on medication if desired . For concerns regarding further COVID symptoms please follow up with your PCP or urgent care . For urgent or life-threatening issues, seek care at your local emergency department  The patient was provided an opportunity to ask questions, and all were answered. The patient agreed with the plan and demonstrated an understanding of the instructions.   Script sent to local pharmacy in [place]  The patient was advised to call their PCP or seek an in-person evaluation if the symptoms worsen or if the condition fails to improve as anticipated.   I provided 22 minutes of non face-to-face telephone visit time during this encounter, and > 50% was spent counseling as documented under my assessment & plan.  StJanene MadeiraNP 07/11/2020 /8Alonna MiniumM

## 2020-07-11 NOTE — Telephone Encounter (Signed)
Called to discuss with patient about COVID-19 symptoms and the use of one of the available treatments for those with mild to moderate Covid symptoms and at a high risk of hospitalization.  Pt appears to qualify for outpatient treatment due to co-morbid conditions and/or a member of an at-risk group in accordance with the FDA Emergency Use Authorization.    Symptom onset: Saturday 07/08/20, pt c/o headache, cough, body aches Vaccinated: yes Booster: yes Immunocompromised: no Qualifiers: aortic dissection s/p cardiac surgery, former smoker NIH Criteria: Tier 3  Pt intrested in treatment. RN informed pt that an APP will follow up to discuss possible options.   Essie Hart, RN

## 2022-11-16 IMAGING — CT CT HEAD W/O CM
3 series · 14 of 47 positions shown, 16 images · non-contrast
Comparison: No pertinent prior exams available for comparison.

CLINICAL DATA: Dizziness, nonspecific. Additional history provided:
this morning.

EXAM:
CT HEAD WITHOUT CONTRAST
TECHNIQUE: Contiguous axial images were obtained from the base of the skull
through the vertex without intravenous contrast.

[Series 2: head wo · axial · 0.47mm/px · z∈[+1443,+1568]mm · 8 of 31 slices shown, 10 images]
[im 3/31  brain]
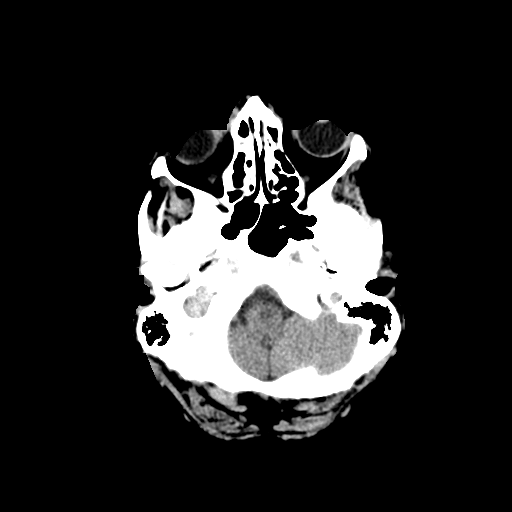
[im 3/31  bone]
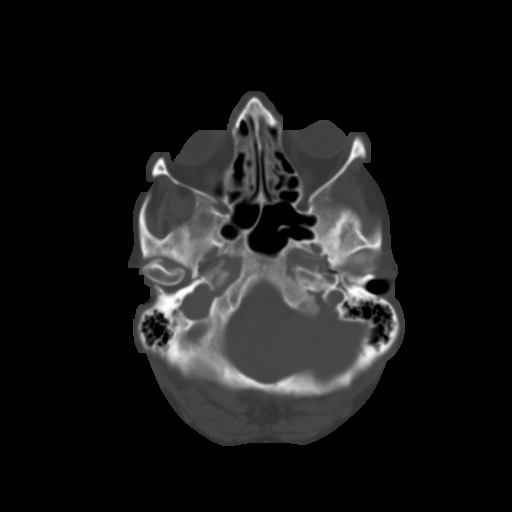
[im 7/31  brain]
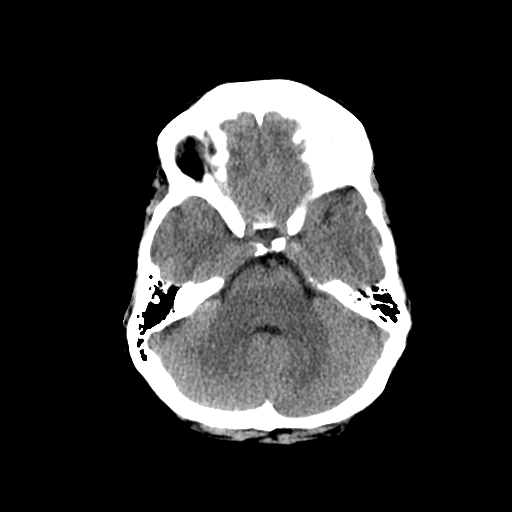
[im 10/31  brain]
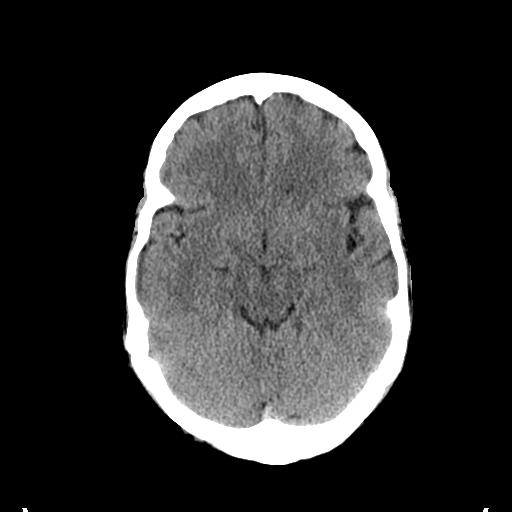
[im 14/31  brain]
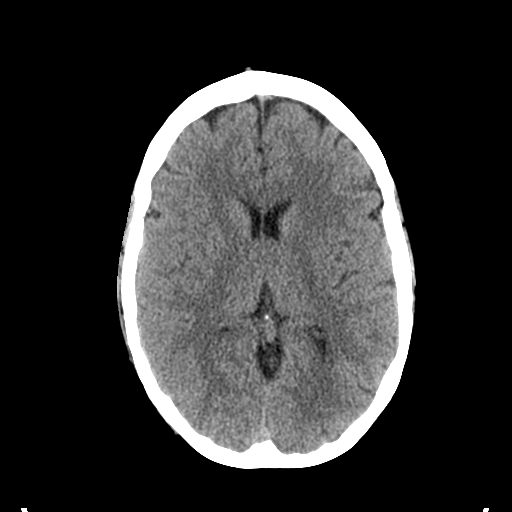
[im 17/31  brain]
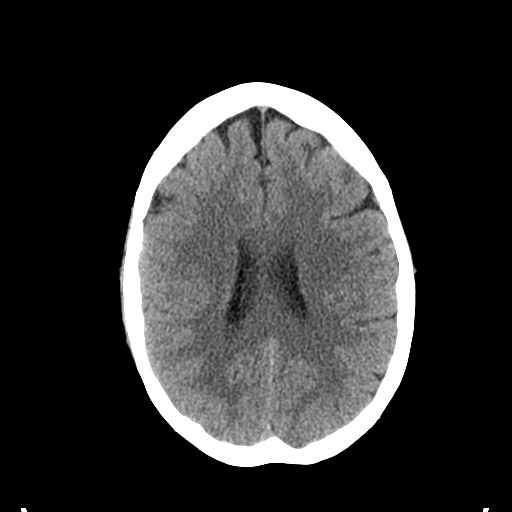
[im 17/31  bone]
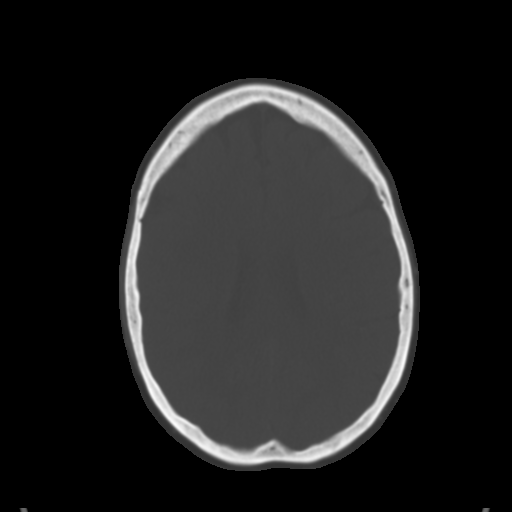
[im 21/31  brain]
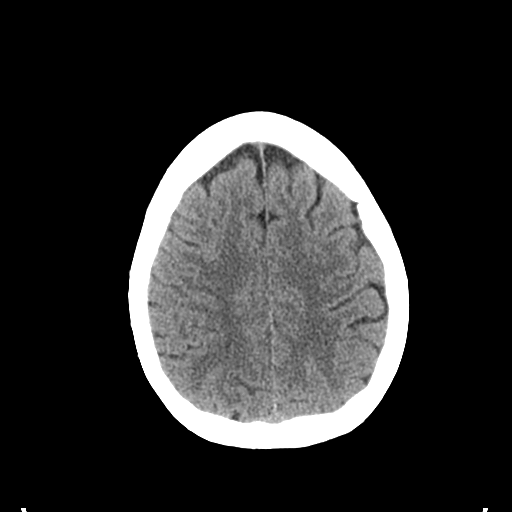
[im 24/31  brain]
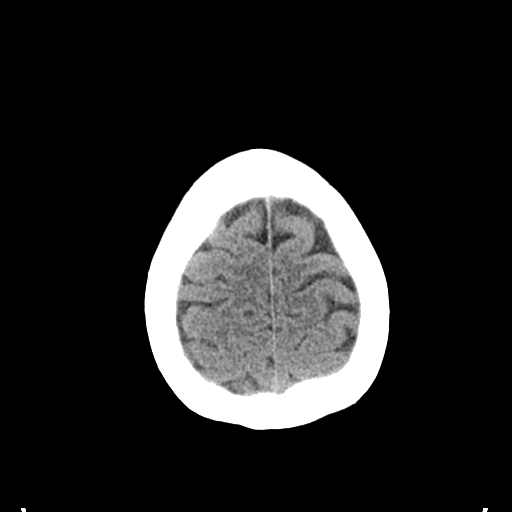
[im 28/31  brain]
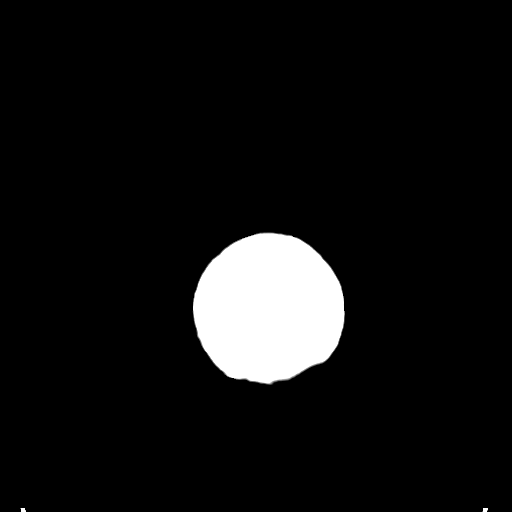

[Series 4: coronal soft tissue · coronal · 0.34mm/px · 3 of 65 slices shown]
[im 22/65  brain]
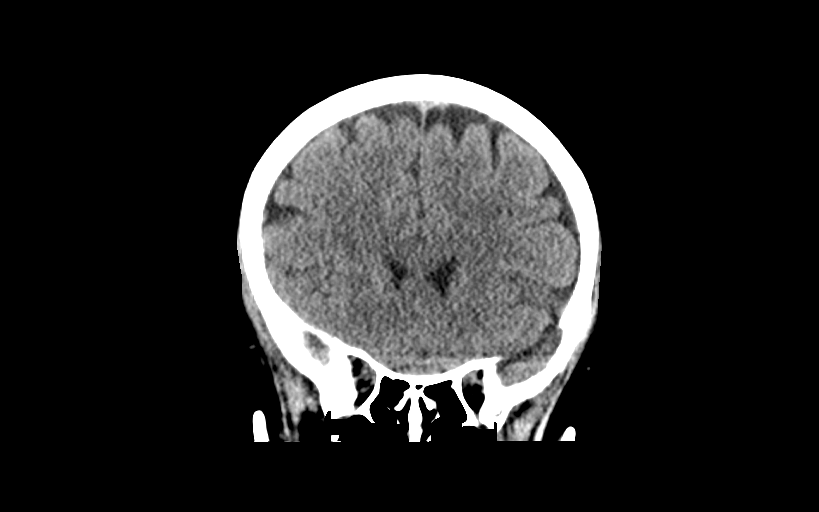
[im 29/65  brain]
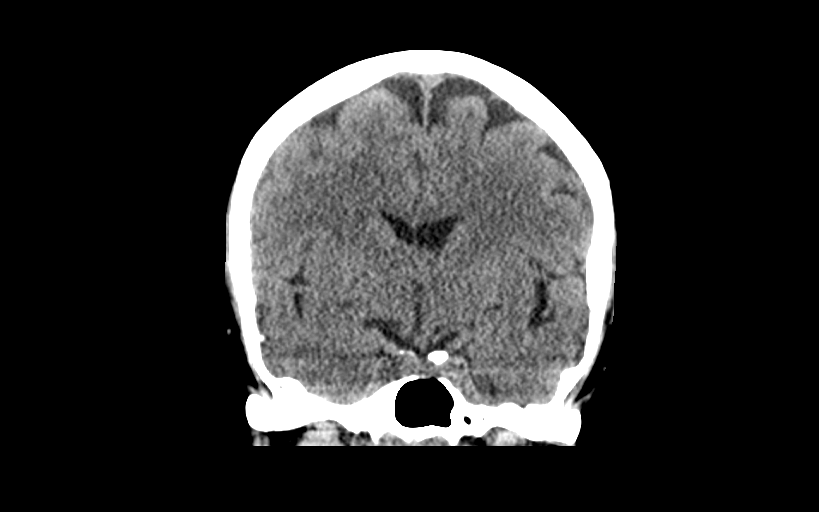
[im 36/65  brain]
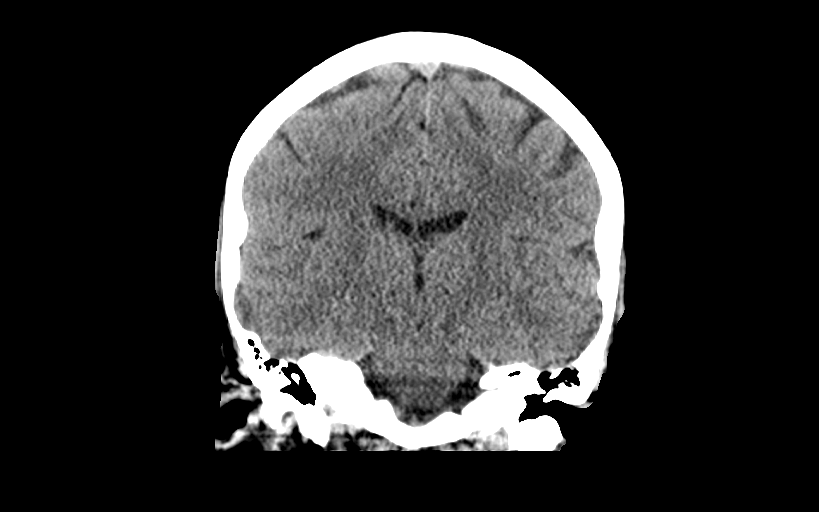

[Series 5: sagittal soft tissue · sagittal · 0.35mm/px · 3 of 47 slices shown]
[im 16/47  brain]
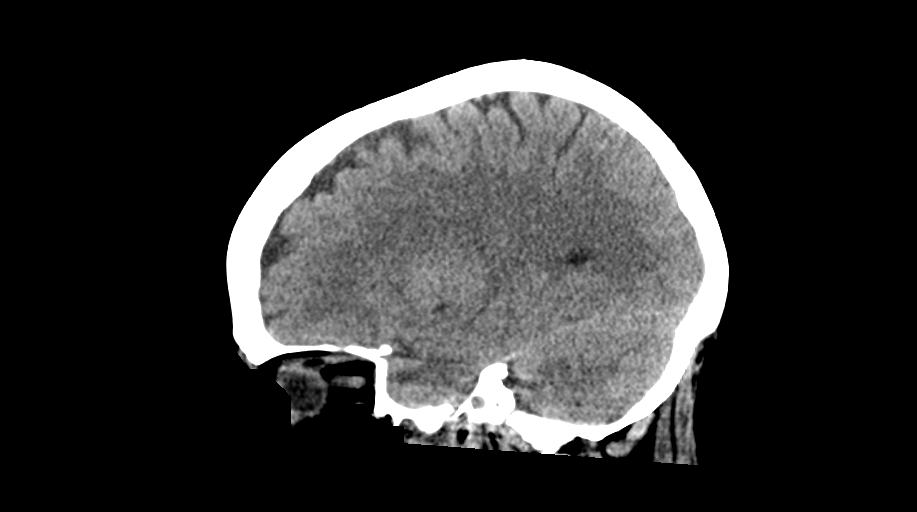
[im 24/47  brain]
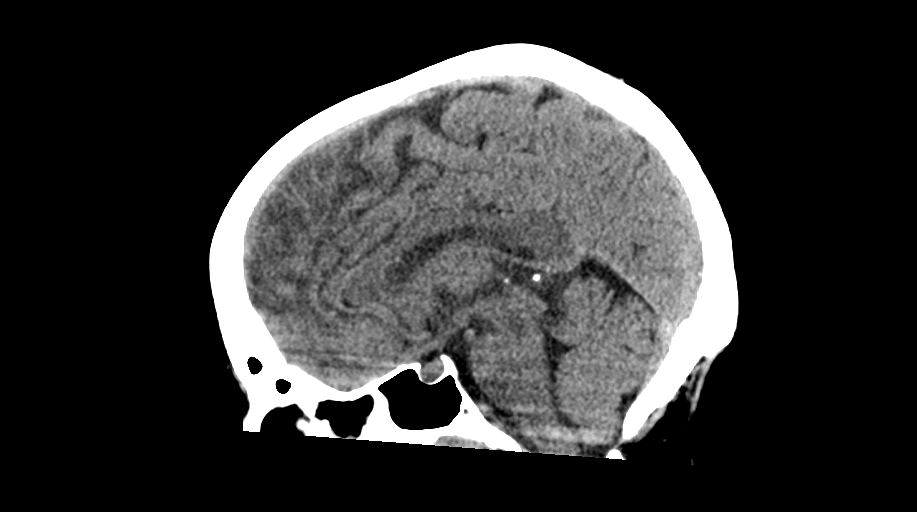
[im 31/47  brain]
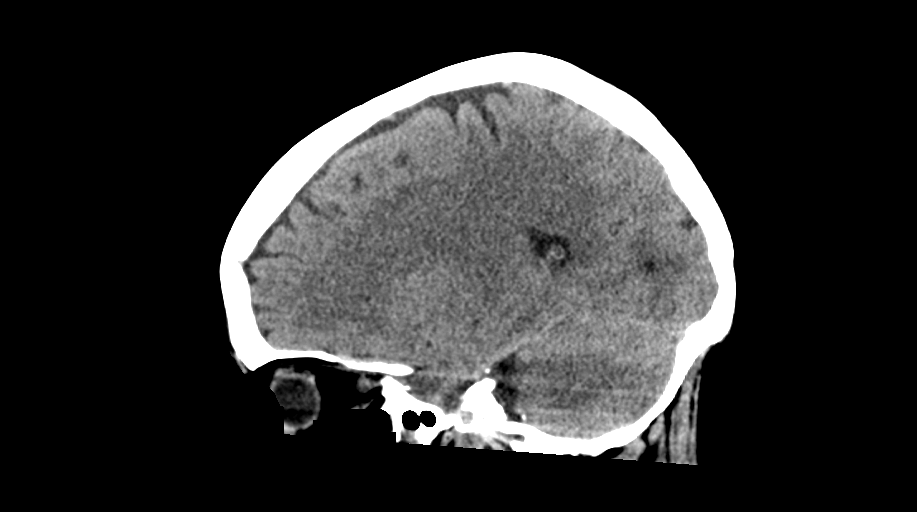

[14 of 47 positions shown; findings below may reference images not displayed]

FINDINGS: Brain:

Cerebral volume is normal.

There is no acute intracranial hemorrhage.

No demarcated cortical infarct.

No extra-axial fluid collection.

No evidence of intracranial mass.

No midline shift.

Vascular: No hyperdense vessel.

Skull: Normal. Negative for fracture or focal lesion.

Sinuses/Orbits: Visualized orbits show no acute finding. Moderate
bilateral ethmoid sinus mucosal thickening. Trace mucosal thickening
within the bilateral sphenoid and visualized maxillary sinuses.
IMPRESSION: Unremarkable non-contrast CT appearance of the brain. No evidence of
acute intracranial abnormality.

Paranasal sinus disease, as described.
